# Patient Record
Sex: Male | Born: 1980
Health system: Southern US, Community
[De-identification: ages and names within clinical notes are randomized; demographics above are authoritative.]

## PROBLEM LIST (undated history)

## (undated) DIAGNOSIS — E785 Hyperlipidemia, unspecified: Secondary | ICD-10-CM

## (undated) HISTORY — DX: Hyperlipidemia, unspecified: E78.5

## (undated) HISTORY — PX: KNEE SURGERY: SHX244

## (undated) HISTORY — PX: EXTERNAL EAR SURGERY: SHX627

---

## 2000-11-19 ENCOUNTER — Emergency Department (HOSPITAL_COMMUNITY): Admission: EM | Admit: 2000-11-19 | Discharge: 2000-11-19 | Payer: Self-pay | Admitting: Emergency Medicine

## 2004-09-27 ENCOUNTER — Emergency Department (HOSPITAL_COMMUNITY): Admission: EM | Admit: 2004-09-27 | Discharge: 2004-09-27 | Payer: Self-pay | Admitting: Emergency Medicine

## 2004-09-27 ENCOUNTER — Observation Stay (HOSPITAL_COMMUNITY): Admission: EM | Admit: 2004-09-27 | Discharge: 2004-09-29 | Payer: Self-pay | Admitting: Emergency Medicine

## 2005-01-05 ENCOUNTER — Ambulatory Visit (HOSPITAL_COMMUNITY): Admission: RE | Admit: 2005-01-05 | Discharge: 2005-01-05 | Payer: Self-pay | Admitting: Orthopedic Surgery

## 2005-01-05 ENCOUNTER — Ambulatory Visit (HOSPITAL_BASED_OUTPATIENT_CLINIC_OR_DEPARTMENT_OTHER): Admission: RE | Admit: 2005-01-05 | Discharge: 2005-01-05 | Payer: Self-pay | Admitting: Orthopedic Surgery

## 2007-02-26 ENCOUNTER — Ambulatory Visit (HOSPITAL_COMMUNITY): Admission: RE | Admit: 2007-02-26 | Discharge: 2007-02-26 | Payer: Self-pay | Admitting: Family Medicine

## 2010-05-31 ENCOUNTER — Ambulatory Visit (HOSPITAL_COMMUNITY)
Admission: RE | Admit: 2010-05-31 | Discharge: 2010-05-31 | Disposition: A | Discharge status: Home / Self Care | Payer: 59 | Source: Ambulatory Visit | Attending: Family Medicine | Admitting: Family Medicine

## 2010-05-31 ENCOUNTER — Other Ambulatory Visit (HOSPITAL_COMMUNITY): Payer: Self-pay | Admitting: Family Medicine

## 2010-05-31 DIAGNOSIS — R0789 Other chest pain: Secondary | ICD-10-CM

## 2010-05-31 DIAGNOSIS — F17200 Nicotine dependence, unspecified, uncomplicated: Secondary | ICD-10-CM | POA: Insufficient documentation

## 2010-08-13 NOTE — Consult Note (Signed)
NAME:  Devon Smith, Devon Smith            ACCOUNT NO.:  [id number]   MEDICAL RECORD NO.:  [id number]          PATIENT TYPE:  OBV   LOCATION:  5041                         FACILITY:  [facility]   PHYSICIAN:  Artist Pais. Weingold, M.D.DATE OF BIRTH:  06-Apr-1980   DATE OF CONSULTATION:  09/27/2004  DATE OF DISCHARGE:                                   CONSULTATION   EMERGENCY ROOM CONSULT.   REFERRING PHYSICIAN:  Rhae Lerner. Margretta Ditty, M.D.   REASON FOR CONSULTATION:  Mr. Aveon Colquhoun is a 30 year old, right-hand-  dominant male who had a columbine injury to his non-dominant left hand, was  transferred to [facility] with an acute open injury to his left  index finger.  He is 30 years old.   ALLERGIES:  TETANUS.   PAST MEDICAL HISTORY:  No significant medical problems otherwise.  No recent  hospital admissions or surgery.   FAMILY HISTORY:  Noncontributory family medical history.   SOCIAL HISTORY:  Noncontributory.   ADMISSION PHYSICAL EXAMINATION:  GENERAL:  On exam today, a well-developed,  well-nourished male, pleasant, alert and oriented x 3.  EXTREMITIES:  Examination of his left index finger, reveals he has an  obvious, grossly contaminated open wound with exposed proximal  interphalangeal joint, loss of extensor mechanism, open PIP joint, possible  neurovascular compromise.   The x-rays show a comminuted fracture at the base of the middle phalanx with  complete obliteration of the joint space.  The rest of his exam is normal.   IMPRESSION:  A 30 year old male with a grade II, open, grossly contaminated  injury to his non-dominant left index finger.   RECOMMENDATIONS:  Recommendations at this point in time are for him to go  the operating room for immediate incision and drainage and repair as  necessary.      MAW/MEDQ  D:  09/27/2004  T:  09/27/2004  Job:  [id number]

## 2010-08-13 NOTE — Op Note (Signed)
NAMECHRISTOPER, Devon Smith            ACCOUNT NO.:  [id number]   MEDICAL RECORD NO.:  [id number]          PATIENT TYPE:  AMB   LOCATION:  DSC                          FACILITY:  [facility]   PHYSICIAN:  Artist Pais. Weingold, M.D.DATE OF BIRTH:  [birth year]   DATE OF PROCEDURE:  DATE OF DISCHARGE:                                 OPERATIVE REPORT   PREOPERATIVE DIAGNOSIS:  Left index finger traumatic injury with nonunion,  middle phalangeal fracture.   POSTOPERATIVE DIAGNOSIS:  Left index finger traumatic injury with nonunion,  middle phalangeal fracture.   OPERATION PERFORMED:  Irrigation and debridement, culturing and proximal  interphalangeal joint fusion.   SURGEON:  Aura Fey. Bobbe Medico.   ANESTHESIA:  General.   TOURNIQUET TIME:  40 minutes.   COMPLICATIONS:  None.   DRAINS:  None.   DESCRIPTION OF PROCEDURE:  The patient was taken to the operating room where  after the induction of adequate general anesthesia the left upper extremity  was prepped and draped in the usual sterile fashion.  An Esmarch was used to  exsanguinate the limb, tourniquet was inflated to 250 mmHg.  At this point  the left index finger was approached.  There ws a significant soft tissue  defect on the radial side which was debrided.  There were large osseous  fragments that were devascularized that were debrided.  These were sent for  culturing.  The entire wound was thoroughly debrided using a combination of  large curettes and rongeurs down to good healthy tissue including  debridement of the skin edges with a 15 blade.  At this point intraoperative  fluoroscopy showed a small remnant of the distal aspect of the middle  phalanx, approximately the distal third and the distal third of the proximal  phalanx.  These two were aligned and fixated with two 0.035 K-wires driven  from distal to proximal in crossed fashion across the fusion site.  The  fusion site was then compressed manually.  The K-wires were cut  and bent  outside the skin and the finger was placed in position in slight flexion at  the proximal phalangeal level in pronation to enable good pinch.  The wound  was then thoroughly irrigated again.  It was closed with 4-0 nylon.  Sterile  dressing of Xeroform, 4 x 4s, and a compressive wrap was applied as well as  a volar splint.  The patient tolerated the procedure well, went to recovery  in stable fashion.      Artist Pais Mina Marble, M.D.  Electronically Signed     MAW/MEDQ  D:  01/05/2005  T:  01/05/2005  Job:  [id number]

## 2010-08-13 NOTE — Op Note (Signed)
NAME:  Devon Smith, ROBB            ACCOUNT NO.:  [id number]   MEDICAL RECORD NO.:  [id number]          PATIENT TYPE:  OBV   LOCATION:  5041                         FACILITY:  [facility]   PHYSICIAN:  Artist Pais. Weingold, M.D.DATE OF BIRTH:  1980/12/19   DATE OF PROCEDURE:  09/27/2004  DATE OF DISCHARGE:                                 OPERATIVE REPORT   PREOPERATIVE DIAGNOSIS:  Grade 2 open contaminated fracture left index  finger.   POSTOPERATIVE DIAGNOSIS:  Grade 2 open contaminated fracture left index  finger.   PROCEDURE:  Irrigation and debridement grossly contaminated grade 2 fracture  PIP joint with ORIF, 035 K-wires x 2, as well as repair of extensor  mechanism, repair of collateral ligament, radial side, and repair of volar  plate.   SURGEON:  Artist Pais. Mina Marble, M.D.   ASSISTANT:  None.   ANESTHESIA:  General.   TOURNIQUET TIME:  1 hour 10 minutes.   COMPLICATIONS:  None.   DRAINS:  None.   DESCRIPTION OF PROCEDURE:  The patient was taken to the operating room and  after the induction of adequate general anesthesia, the left upper extremity  was prepped and draped in the usual sterile fashion.  Esmarch was used to  exsanguinate the limb and the tourniquet was inflated to 250 mmHg.  At this  point in time, a grossly contaminated open fracture dislocation at the PIP  joint was approached.  There was gross contamination and this was thoroughly  irrigated with 3 liters of normal saline.  After this was done, examination  revealed a complete loss of structural integrity of the radial side of the  proximal phalangeal joint, neurovascular bundles were intact on both the  radial and ulnar side, however, the collateral ligament, the joint capsule,  the extensor mechanism, and the base of the phalanx were completely  destroyed by this combined injury.  The patient was placed in traction and  under fluoroscopic guidance, the middle phalanx was pinned to the head of  the  proximal phalanx to restore normal anatomy for future PIP fusion.  The  extensor mechanism, the collateral ligaments, and the volar plate were all  repaired using 4-0 Vicryl.  The wound was thoroughly irrigated again and  loosely closed with 4-0 nylon.  A sterile dressing of Xeroform, 4 by 4s, and  a splint was applied.  The patient tolerated the procedure well and went to  the recovery room in stable condition.     MAW/MEDQ  D:  09/27/2004  T:  09/27/2004  Job:  [id number]

## 2011-01-26 ENCOUNTER — Other Ambulatory Visit (HOSPITAL_COMMUNITY): Payer: Self-pay | Admitting: Orthopedic Surgery

## 2011-01-26 DIAGNOSIS — M25569 Pain in unspecified knee: Secondary | ICD-10-CM

## 2011-01-28 ENCOUNTER — Ambulatory Visit (HOSPITAL_COMMUNITY)
Admission: RE | Admit: 2011-01-28 | Discharge: 2011-01-28 | Disposition: A | Discharge status: Home / Self Care | Payer: 59 | Source: Ambulatory Visit | Attending: Orthopedic Surgery | Admitting: Orthopedic Surgery

## 2011-01-28 DIAGNOSIS — M25569 Pain in unspecified knee: Secondary | ICD-10-CM | POA: Insufficient documentation

## 2011-01-28 DIAGNOSIS — M23329 Other meniscus derangements, posterior horn of medial meniscus, unspecified knee: Secondary | ICD-10-CM | POA: Insufficient documentation

## 2012-06-12 ENCOUNTER — Other Ambulatory Visit (HOSPITAL_COMMUNITY): Payer: Self-pay | Admitting: Family Medicine

## 2012-06-12 ENCOUNTER — Ambulatory Visit (HOSPITAL_COMMUNITY)
Admission: RE | Admit: 2012-06-12 | Discharge: 2012-06-12 | Disposition: A | Discharge status: Home / Self Care | Payer: 59 | Source: Ambulatory Visit | Attending: Family Medicine | Admitting: Family Medicine

## 2012-06-12 DIAGNOSIS — R918 Other nonspecific abnormal finding of lung field: Secondary | ICD-10-CM | POA: Insufficient documentation

## 2012-06-12 DIAGNOSIS — F17290 Nicotine dependence, other tobacco product, uncomplicated: Secondary | ICD-10-CM

## 2012-06-12 DIAGNOSIS — F17200 Nicotine dependence, unspecified, uncomplicated: Secondary | ICD-10-CM | POA: Insufficient documentation

## 2013-11-26 ENCOUNTER — Ambulatory Visit (HOSPITAL_COMMUNITY)
Admission: RE | Admit: 2013-11-26 | Discharge: 2013-11-26 | Disposition: A | Discharge status: Home / Self Care | Payer: 59 | Source: Ambulatory Visit | Attending: Family Medicine | Admitting: Family Medicine

## 2013-11-26 ENCOUNTER — Other Ambulatory Visit (HOSPITAL_COMMUNITY): Payer: Self-pay | Admitting: Family Medicine

## 2013-11-26 DIAGNOSIS — F17200 Nicotine dependence, unspecified, uncomplicated: Secondary | ICD-10-CM | POA: Insufficient documentation

## 2013-11-26 DIAGNOSIS — F17210 Nicotine dependence, cigarettes, uncomplicated: Secondary | ICD-10-CM

## 2015-07-14 DIAGNOSIS — I10 Essential (primary) hypertension: Secondary | ICD-10-CM | POA: Diagnosis not present

## 2015-07-14 DIAGNOSIS — E118 Type 2 diabetes mellitus with unspecified complications: Secondary | ICD-10-CM | POA: Diagnosis not present

## 2015-09-24 ENCOUNTER — Other Ambulatory Visit (HOSPITAL_COMMUNITY): Payer: Self-pay | Admitting: Orthopedic Surgery

## 2015-09-24 DIAGNOSIS — M25521 Pain in right elbow: Secondary | ICD-10-CM | POA: Diagnosis not present

## 2015-10-05 ENCOUNTER — Ambulatory Visit (HOSPITAL_COMMUNITY)
Admission: RE | Admit: 2015-10-05 | Discharge: 2015-10-05 | Disposition: A | Discharge status: Home / Self Care | Payer: 59 | Source: Ambulatory Visit | Attending: Orthopedic Surgery | Admitting: Orthopedic Surgery

## 2015-10-05 DIAGNOSIS — M25521 Pain in right elbow: Secondary | ICD-10-CM | POA: Insufficient documentation

## 2015-10-05 DIAGNOSIS — X58XXXA Exposure to other specified factors, initial encounter: Secondary | ICD-10-CM | POA: Diagnosis not present

## 2015-10-05 DIAGNOSIS — S46211A Strain of muscle, fascia and tendon of other parts of biceps, right arm, initial encounter: Secondary | ICD-10-CM | POA: Insufficient documentation

## 2015-10-05 DIAGNOSIS — M75101 Unspecified rotator cuff tear or rupture of right shoulder, not specified as traumatic: Secondary | ICD-10-CM | POA: Diagnosis not present

## 2015-12-04 DIAGNOSIS — H16201 Unspecified keratoconjunctivitis, right eye: Secondary | ICD-10-CM | POA: Diagnosis not present

## 2017-02-06 DIAGNOSIS — Z0001 Encounter for general adult medical examination with abnormal findings: Secondary | ICD-10-CM | POA: Diagnosis not present

## 2017-02-06 DIAGNOSIS — E782 Mixed hyperlipidemia: Secondary | ICD-10-CM | POA: Diagnosis not present

## 2017-02-06 DIAGNOSIS — Z6833 Body mass index (BMI) 33.0-33.9, adult: Secondary | ICD-10-CM | POA: Diagnosis not present

## 2017-02-06 DIAGNOSIS — Z1389 Encounter for screening for other disorder: Secondary | ICD-10-CM | POA: Diagnosis not present

## 2017-02-06 DIAGNOSIS — E6609 Other obesity due to excess calories: Secondary | ICD-10-CM | POA: Diagnosis not present

## 2017-02-06 DIAGNOSIS — Z23 Encounter for immunization: Secondary | ICD-10-CM | POA: Diagnosis not present

## 2017-05-10 DIAGNOSIS — M25562 Pain in left knee: Secondary | ICD-10-CM | POA: Diagnosis not present

## 2017-05-10 DIAGNOSIS — M238X2 Other internal derangements of left knee: Secondary | ICD-10-CM | POA: Diagnosis not present

## 2017-06-03 DIAGNOSIS — M25562 Pain in left knee: Secondary | ICD-10-CM | POA: Diagnosis not present

## 2017-06-29 DIAGNOSIS — M94262 Chondromalacia, left knee: Secondary | ICD-10-CM | POA: Diagnosis not present

## 2017-06-29 DIAGNOSIS — S83242A Other tear of medial meniscus, current injury, left knee, initial encounter: Secondary | ICD-10-CM | POA: Diagnosis not present

## 2017-06-29 DIAGNOSIS — G8918 Other acute postprocedural pain: Secondary | ICD-10-CM | POA: Diagnosis not present

## 2017-06-29 DIAGNOSIS — M23232 Derangement of other medial meniscus due to old tear or injury, left knee: Secondary | ICD-10-CM | POA: Diagnosis not present

## 2017-06-29 MED FILL — MELOXICAM 15 MG TABLET: 15 | 30 days supply | Qty: 60 | Fill #0

## 2017-06-29 MED FILL — ASPIRIN EC 325 MG TABLET: 325 | 30 days supply | Qty: 30 | Fill #0

## 2017-06-29 MED FILL — HYDROCODON-APAP 5-325: 5-325 | 5 days supply | Qty: 30 | Fill #0

## 2017-10-11 ENCOUNTER — Ambulatory Visit
Admission: RE | Admit: 2017-10-11 | Discharge: 2017-10-11 | Disposition: A | Discharge status: Home / Self Care | Payer: 59 | Source: Ambulatory Visit | Attending: Surgery | Admitting: Surgery

## 2017-10-11 ENCOUNTER — Other Ambulatory Visit: Payer: Self-pay | Admitting: Surgery

## 2017-10-11 DIAGNOSIS — R112 Nausea with vomiting, unspecified: Secondary | ICD-10-CM

## 2017-10-11 DIAGNOSIS — R1031 Right lower quadrant pain: Secondary | ICD-10-CM | POA: Diagnosis not present

## 2017-10-11 MED ORDER — IOPAMIDOL (ISOVUE-300) INJECTION 61%
100.0000 mL | Freq: Once | INTRAVENOUS | Status: AC | PRN
  Administered 2017-10-11: 100 mL via INTRAVENOUS

## 2017-10-12 DIAGNOSIS — R112 Nausea with vomiting, unspecified: Secondary | ICD-10-CM | POA: Diagnosis not present

## 2017-10-16 ENCOUNTER — Other Ambulatory Visit: Payer: Self-pay | Admitting: Surgery

## 2017-11-09 DIAGNOSIS — Z1389 Encounter for screening for other disorder: Secondary | ICD-10-CM | POA: Diagnosis not present

## 2017-11-09 DIAGNOSIS — Z024 Encounter for examination for driving license: Secondary | ICD-10-CM | POA: Diagnosis not present

## 2017-11-09 DIAGNOSIS — E6609 Other obesity due to excess calories: Secondary | ICD-10-CM | POA: Diagnosis not present

## 2017-11-09 DIAGNOSIS — R319 Hematuria, unspecified: Secondary | ICD-10-CM | POA: Diagnosis not present

## 2017-11-09 DIAGNOSIS — Z6833 Body mass index (BMI) 33.0-33.9, adult: Secondary | ICD-10-CM | POA: Diagnosis not present

## 2017-11-09 DIAGNOSIS — E782 Mixed hyperlipidemia: Secondary | ICD-10-CM | POA: Diagnosis not present

## 2017-12-08 DIAGNOSIS — J029 Acute pharyngitis, unspecified: Secondary | ICD-10-CM | POA: Diagnosis not present

## 2017-12-08 DIAGNOSIS — Z1389 Encounter for screening for other disorder: Secondary | ICD-10-CM | POA: Diagnosis not present

## 2017-12-08 DIAGNOSIS — J020 Streptococcal pharyngitis: Secondary | ICD-10-CM | POA: Diagnosis not present

## 2017-12-08 DIAGNOSIS — Z6834 Body mass index (BMI) 34.0-34.9, adult: Secondary | ICD-10-CM | POA: Diagnosis not present

## 2017-12-08 MED FILL — AMOX-CLAV 875-125 MG TABLET: 875-125 | 10 days supply | Qty: 20 | Fill #0

## 2019-10-03 DIAGNOSIS — H6981 Other specified disorders of Eustachian tube, right ear: Secondary | ICD-10-CM | POA: Diagnosis not present

## 2019-10-03 DIAGNOSIS — H7292 Unspecified perforation of tympanic membrane, left ear: Secondary | ICD-10-CM | POA: Diagnosis not present

## 2019-10-03 DIAGNOSIS — H900 Conductive hearing loss, bilateral: Secondary | ICD-10-CM | POA: Diagnosis not present

## 2019-10-22 ENCOUNTER — Other Ambulatory Visit (HOSPITAL_COMMUNITY): Payer: Self-pay | Admitting: Family Medicine

## 2019-10-22 ENCOUNTER — Ambulatory Visit (HOSPITAL_COMMUNITY)
Admission: RE | Admit: 2019-10-22 | Discharge: 2019-10-22 | Disposition: A | Discharge status: Home / Self Care | Payer: 59 | Source: Ambulatory Visit | Attending: Family Medicine | Admitting: Family Medicine

## 2019-10-22 ENCOUNTER — Other Ambulatory Visit: Payer: Self-pay

## 2019-10-22 DIAGNOSIS — Z6832 Body mass index (BMI) 32.0-32.9, adult: Secondary | ICD-10-CM | POA: Diagnosis not present

## 2019-10-22 DIAGNOSIS — E782 Mixed hyperlipidemia: Secondary | ICD-10-CM | POA: Diagnosis not present

## 2019-10-22 DIAGNOSIS — R059 Cough, unspecified: Secondary | ICD-10-CM

## 2019-10-22 DIAGNOSIS — R05 Cough: Secondary | ICD-10-CM | POA: Insufficient documentation

## 2019-10-22 DIAGNOSIS — Z0001 Encounter for general adult medical examination with abnormal findings: Secondary | ICD-10-CM | POA: Diagnosis not present

## 2019-10-22 DIAGNOSIS — Z1389 Encounter for screening for other disorder: Secondary | ICD-10-CM | POA: Diagnosis not present

## 2019-10-22 DIAGNOSIS — R000 Tachycardia, unspecified: Secondary | ICD-10-CM | POA: Diagnosis not present

## 2019-10-22 DIAGNOSIS — Z719 Counseling, unspecified: Secondary | ICD-10-CM | POA: Diagnosis not present

## 2019-10-22 DIAGNOSIS — E6609 Other obesity due to excess calories: Secondary | ICD-10-CM | POA: Diagnosis not present

## 2020-02-04 DIAGNOSIS — M25511 Pain in right shoulder: Secondary | ICD-10-CM | POA: Diagnosis not present

## 2020-02-04 DIAGNOSIS — M7541 Impingement syndrome of right shoulder: Secondary | ICD-10-CM | POA: Diagnosis not present

## 2020-02-04 DIAGNOSIS — M25512 Pain in left shoulder: Secondary | ICD-10-CM | POA: Diagnosis not present

## 2020-04-01 ENCOUNTER — Other Ambulatory Visit (HOSPITAL_COMMUNITY): Payer: Self-pay | Admitting: Physician Assistant

## 2020-04-01 DIAGNOSIS — M25511 Pain in right shoulder: Secondary | ICD-10-CM

## 2020-04-01 DIAGNOSIS — M25512 Pain in left shoulder: Secondary | ICD-10-CM

## 2020-04-14 ENCOUNTER — Ambulatory Visit (HOSPITAL_COMMUNITY)
Admission: RE | Admit: 2020-04-14 | Discharge: 2020-04-14 | Disposition: A | Discharge status: Home / Self Care | Payer: 59 | Source: Ambulatory Visit | Attending: Physician Assistant | Admitting: Physician Assistant

## 2020-04-14 ENCOUNTER — Other Ambulatory Visit: Payer: Self-pay

## 2020-04-14 DIAGNOSIS — M75122 Complete rotator cuff tear or rupture of left shoulder, not specified as traumatic: Secondary | ICD-10-CM | POA: Diagnosis not present

## 2020-04-14 DIAGNOSIS — M25512 Pain in left shoulder: Secondary | ICD-10-CM | POA: Diagnosis not present

## 2020-04-14 DIAGNOSIS — M19011 Primary osteoarthritis, right shoulder: Secondary | ICD-10-CM | POA: Diagnosis not present

## 2020-04-14 DIAGNOSIS — M25511 Pain in right shoulder: Secondary | ICD-10-CM | POA: Insufficient documentation

## 2020-04-20 DIAGNOSIS — Z20822 Contact with and (suspected) exposure to covid-19: Secondary | ICD-10-CM | POA: Diagnosis not present

## 2020-04-28 DIAGNOSIS — M25511 Pain in right shoulder: Secondary | ICD-10-CM | POA: Diagnosis not present

## 2020-04-28 DIAGNOSIS — M25512 Pain in left shoulder: Secondary | ICD-10-CM | POA: Diagnosis not present

## 2020-04-28 DIAGNOSIS — M75100 Unspecified rotator cuff tear or rupture of unspecified shoulder, not specified as traumatic: Secondary | ICD-10-CM | POA: Diagnosis not present

## 2021-03-04 DIAGNOSIS — J111 Influenza due to unidentified influenza virus with other respiratory manifestations: Secondary | ICD-10-CM | POA: Diagnosis not present

## 2021-09-14 IMAGING — MR MR SHOULDER*L* W/O CM
6 series · 40 of 40 positions shown · non-contrast
Comparison: None.

CLINICAL DATA: Left shoulder pain for 2 months

EXAM:
MRI OF THE LEFT SHOULDER WITHOUT CONTRAST
TECHNIQUE: Multiplanar, multisequence MR imaging of the shoulder was performed.
No intravenous contrast was administered.

[Series 5: T2 fat-sat · axial · left · 4.0mm · 0.47mm/px · z∈[-116,-8]mm · 6 of 26 slices shown (1 of 3)]
[im 1/26]
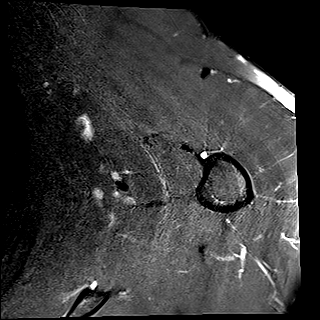
[im 6/26]
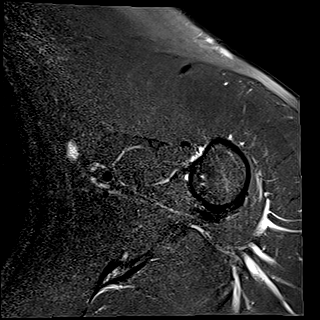
[im 11/26]
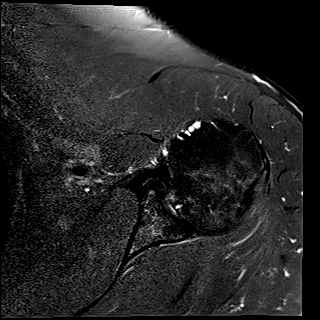
[im 16/26]
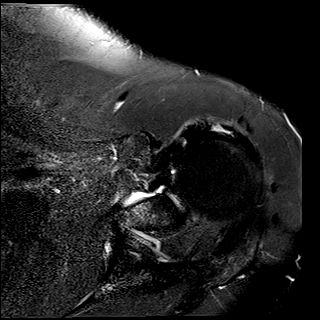
[im 21/26]
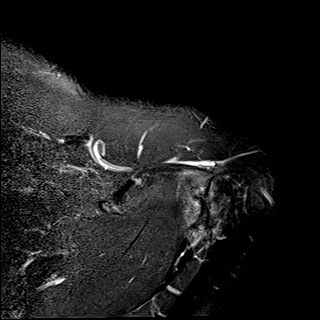
[im 26/26]
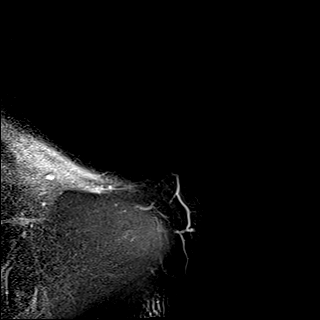

[Series 6: T2 fat-sat · oblique · left · 4.0mm · 0.50mm/px · 6 of 26 slices shown (2 of 3)]
[im 1/26]
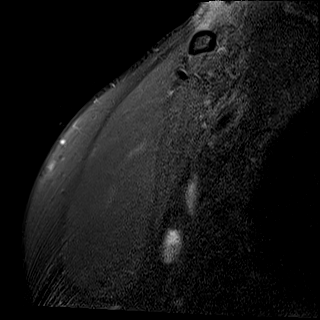
[im 6/26]
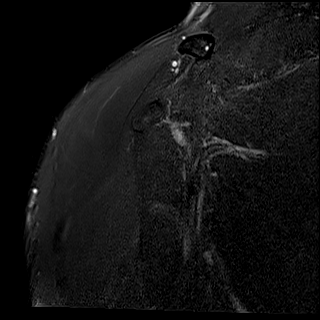
[im 11/26]
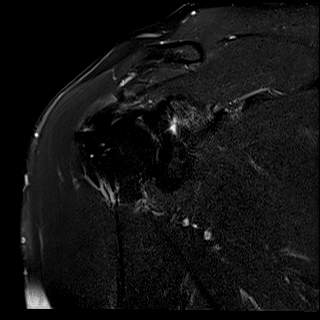
[im 16/26]
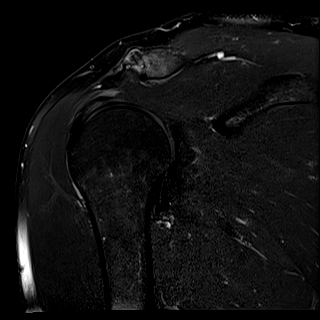
[im 21/26]
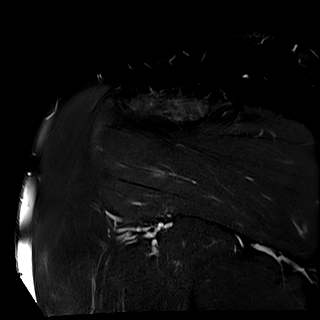
[im 26/26]
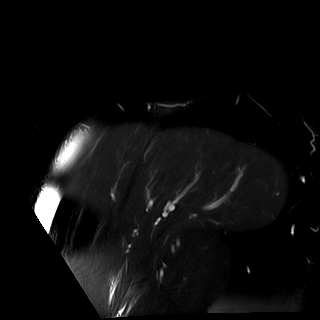

[Series 7: PD · oblique · left · 4.0mm · 0.50mm/px · 7 of 26 slices shown]
[im 1/26]
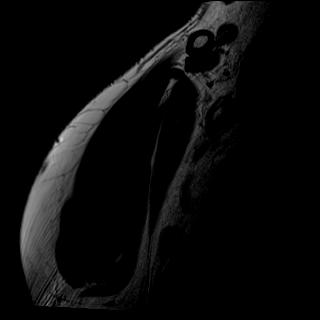
[im 5/26]
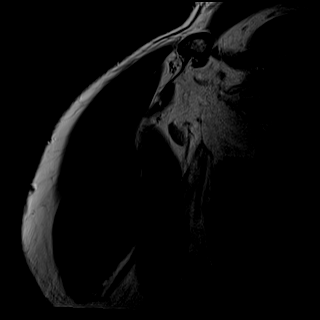
[im 9/26]
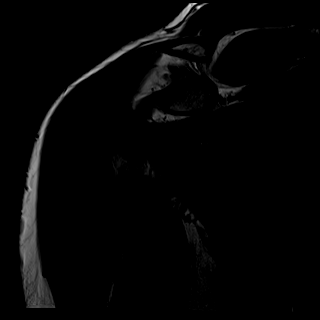
[im 13/26]
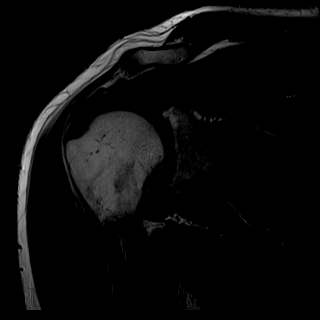
[im 17/26]
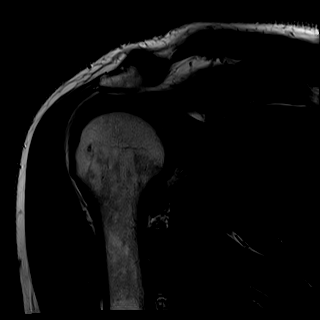
[im 21/26]
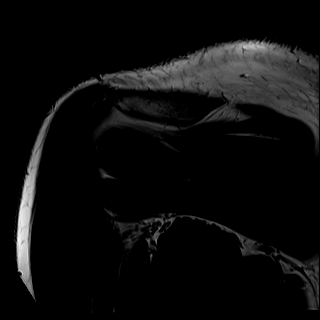
[im 26/26]
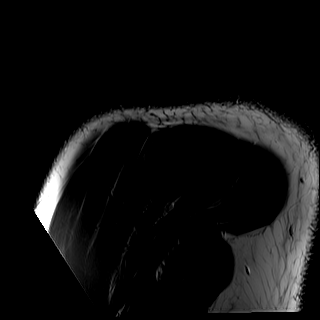

[Series 8: T2 fat-sat · oblique · left · 4.0mm · 0.50mm/px · 7 of 25 slices shown (3 of 3)]
[im 1/25]
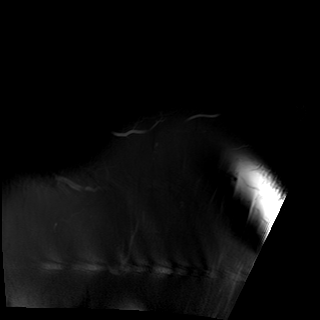
[im 5/25]
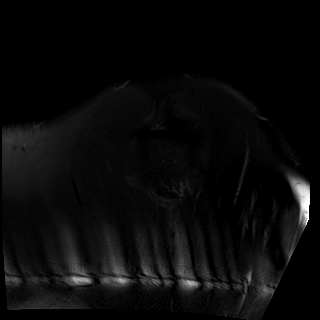
[im 9/25]
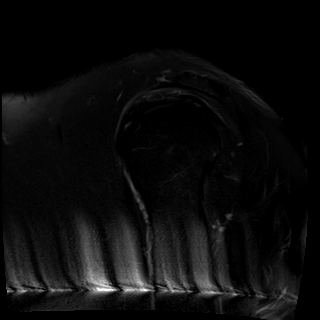
[im 13/25]
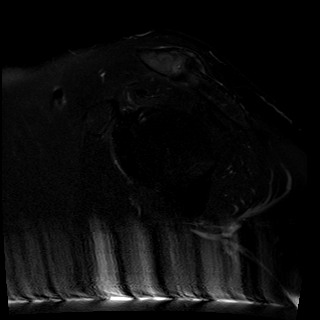
[im 17/25]
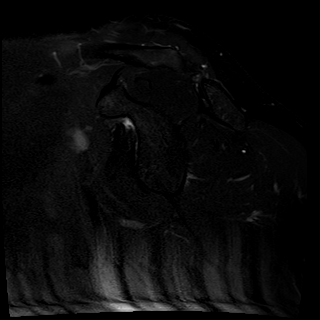
[im 21/25]
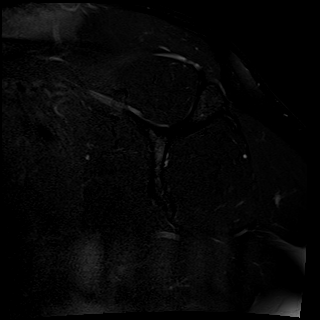
[im 25/25]
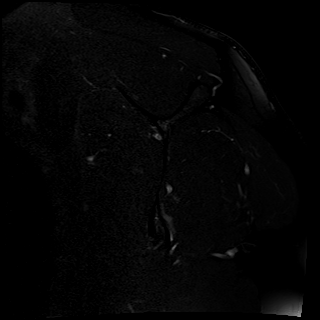

[Series 9: T1 · oblique · left · 4.0mm · 0.43mm/px · 7 of 25 slices shown]
[im 1/25]
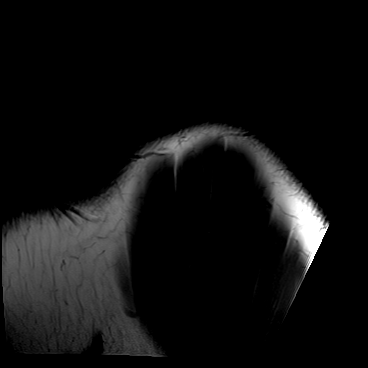
[im 5/25]
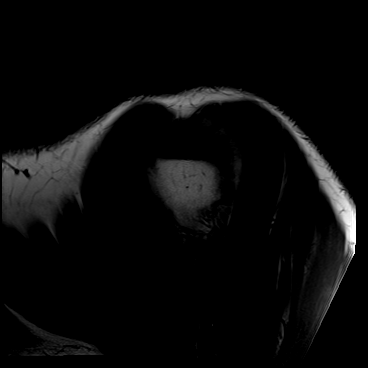
[im 9/25]
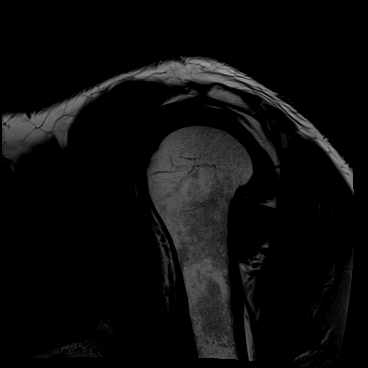
[im 13/25]
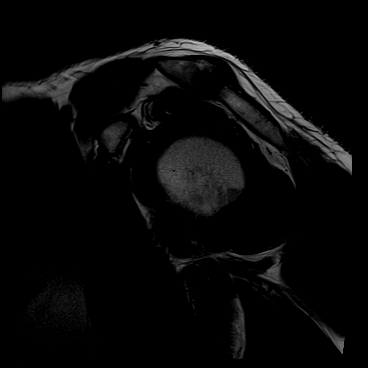
[im 17/25]
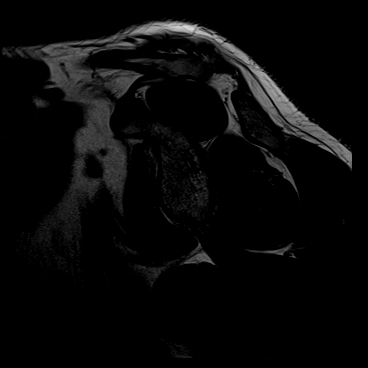
[im 21/25]
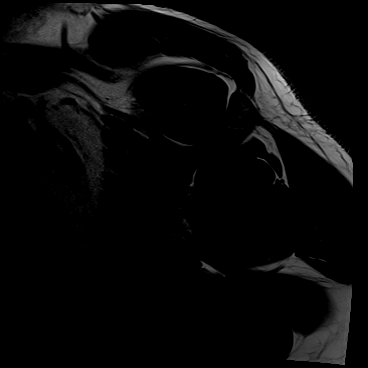
[im 25/25]
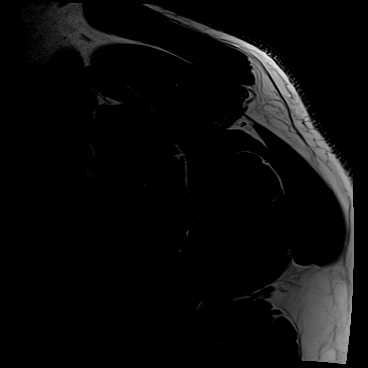

[Series 10: STIR · oblique · left · 4.0mm · 0.62mm/px · 7 of 25 slices shown]
[im 1/25]
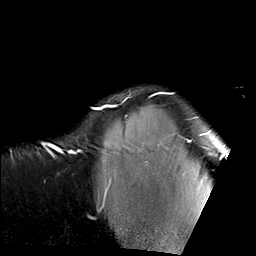
[im 5/25]
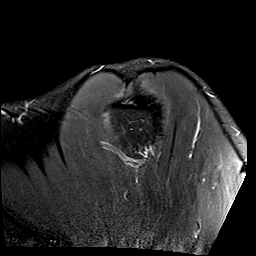
[im 9/25]
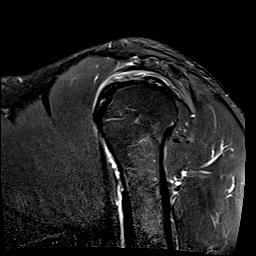
[im 13/25]
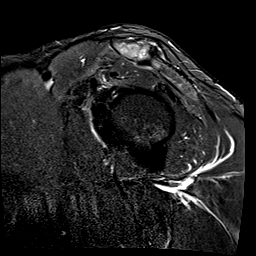
[im 17/25]
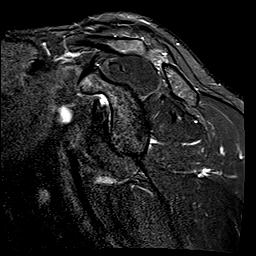
[im 21/25]
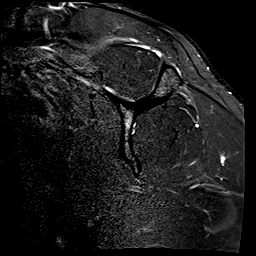
[im 25/25]
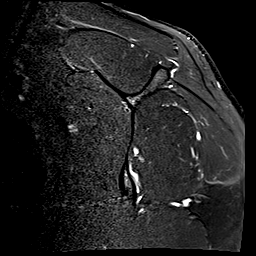

[40 of 40 positions shown; findings below may reference images not displayed]

FINDINGS: Rotator cuff: Near full-thickness articular sided tear of the
anterior insertional fibers of the distal supraspinatus tendon
measuring 11 mm in AP dimension (series 6, images 12-13). Mild
subscapularis tendinosis without focal tear. Infraspinatus and teres
minor tendons intact. No full thickness rotator cuff tear.

Muscles: Preserved bulk and signal intensity of the rotator cuff
musculature without edema, atrophy, or fatty infiltration.

Biceps long head:  Intact.

Acromioclavicular Joint: Mild arthropathy of the acromioclavicular
joint. No subacromial-subdeltoid bursal fluid.

Glenohumeral Joint: No joint effusion. No chondral defect.

Labrum: Grossly intact, but evaluation is limited by lack of
intraarticular fluid.

Bones:  No marrow abnormality, fracture or dislocation.

Other: None.
IMPRESSION: 1. Focal high-grade articular-sided tear of the anterior insertional
fibers of the distal supraspinatus tendon.
2. Mild subscapularis tendinosis without focal tear.
3. Mild acromioclavicular arthropathy.

## 2021-10-15 DIAGNOSIS — E785 Hyperlipidemia, unspecified: Secondary | ICD-10-CM | POA: Diagnosis not present

## 2021-10-15 DIAGNOSIS — Z6833 Body mass index (BMI) 33.0-33.9, adult: Secondary | ICD-10-CM | POA: Diagnosis not present

## 2021-10-15 DIAGNOSIS — Z0001 Encounter for general adult medical examination with abnormal findings: Secondary | ICD-10-CM | POA: Diagnosis not present

## 2022-05-04 NOTE — Progress Notes (Deleted)
     Rexford Maus, MD Reason for referral-hyperlipidemia  HPI: 42 year old male for evaluation of hyperlipidemia at request of Sharilyn Sites, MD.  No current outpatient medications on file.   No current facility-administered medications for this visit.    Not on File  No past medical history on file.  *** The histories are not reviewed yet. Please review them in the "History" navigator section and refresh this [place].  Social History   Socioeconomic History   Marital status: Married    Spouse name: Not on file   Number of children: Not on file   Years of education: Not on file   Highest education level: Not on file  Occupational History   Not on file  Tobacco Use   Smoking status: Not on file   Smokeless tobacco: Not on file  Substance and Sexual Activity   Alcohol use: Not on file   Drug use: Not on file   Sexual activity: Not on file  Other Topics Concern   Not on file  Social History Narrative   Not on file   Social Determinants of Health   Financial Resource Strain: Not on file  Food Insecurity: Not on file  Transportation Needs: Not on file  Physical Activity: Not on file  Stress: Not on file  Social Connections: Not on file  Intimate Partner Violence: Not on file    No family history on file.  ROS: no fevers or chills, productive cough, hemoptysis, dysphasia, odynophagia, melena, hematochezia, dysuria, hematuria, rash, seizure activity, orthopnea, PND, pedal edema, claudication. Remaining systems are negative.  Physical Exam:   There were no vitals taken for this visit.  General:  Well developed/well nourished in NAD Skin warm/dry Patient not depressed No peripheral clubbing Back-normal HEENT-normal/normal eyelids Neck supple/normal carotid upstroke bilaterally; no bruits; no JVD; no thyromegaly chest - CTA/ normal expansion CV - RRR/normal S1 and S2; no murmurs, rubs or gallops;  PMI nondisplaced Abdomen -NT/ND, no HSM, no mass,  + bowel sounds, no bruit 2+ femoral pulses, no bruits Ext-no edema, chords, 2+ DP Neuro-grossly nonfocal  ECG - personally reviewed  A/P  1 hyperlipidemia-  Kirk Ruths, MD

## 2022-05-18 ENCOUNTER — Other Ambulatory Visit (HOSPITAL_COMMUNITY): Payer: Self-pay

## 2022-05-18 ENCOUNTER — Ambulatory Visit: Payer: Commercial Managed Care - PPO | Attending: Cardiology | Admitting: Cardiology

## 2022-05-18 ENCOUNTER — Ambulatory Visit: Payer: Commercial Managed Care - PPO | Admitting: Cardiology

## 2022-05-18 ENCOUNTER — Encounter: Payer: Self-pay | Admitting: Cardiology

## 2022-05-18 VITALS — BP 110/70 | HR 73 | Ht 72.0 in | Wt 251.8 lb

## 2022-05-18 DIAGNOSIS — R072 Precordial pain: Secondary | ICD-10-CM

## 2022-05-18 MED ORDER — ROSUVASTATIN CALCIUM 40 MG PO TABS
40.0000 mg | ORAL_TABLET | Freq: Every day | ORAL | 3 refills | Status: AC
  Filled 2022-05-18: qty 90, 90d supply, fill #0

## 2022-05-18 MED ORDER — METOPROLOL TARTRATE 100 MG PO TABS
ORAL_TABLET | ORAL | 0 refills | Status: AC
  Filled 2022-05-18: qty 1, 1d supply, fill #0

## 2022-05-18 NOTE — Progress Notes (Signed)
     Referring-Angus McInnis, MD Reason for referral-hyperlipidemia and chest pain  HPI: 42 year old male for evaluation of hyperlipidemia and chest pain at request of Marjean Donna MD. Laboratories July 2023 showed total cholesterol 243 and LDL 143.  Patient has had occasional chest pain for approximately 1 year.  The pain is in the chest area and described as "pushing out".  Not clearly related to activities.  Typically last 15 minutes and resolves.  No associated symptoms.  He also has dyspnea on exertion but no orthopnea, PND and no history of syncope.  Occasional minimal pedal edema.  Because of the above cardiology asked to evaluate.  No current outpatient medications on file.   No current facility-administered medications for this visit.    No Known Allergies   Past Medical History:  Diagnosis Date   Hyperlipidemia     Past Surgical History:  Procedure Laterality Date   EXTERNAL EAR SURGERY     KNEE SURGERY      Social History   Socioeconomic History   Marital status: Married    Spouse name: Not on file   Number of children: Not on file   Years of education: Not on file   Highest education level: Not on file  Occupational History   Not on file  Tobacco Use   Smoking status: Every Day    Types: Cigarettes   Smokeless tobacco: Current  Substance and Sexual Activity   Alcohol use: Yes    Comment: Occasional   Drug use: Never   Sexual activity: Not on file  Other Topics Concern   Not on file  Social History Narrative   Not on file   Social Determinants of Health   Financial Resource Strain: Not on file  Food Insecurity: Not on file  Transportation Needs: Not on file  Physical Activity: Not on file  Stress: Not on file  Social Connections: Not on file  Intimate Partner Violence: Not on file    Family History  Problem Relation Age of Onset   Coronary artery disease Father     ROS: no fevers or chills, productive cough, hemoptysis, dysphasia,  odynophagia, melena, hematochezia, dysuria, hematuria, rash, seizure activity, orthopnea, PND, pedal edema, claudication. Remaining systems are negative.  Physical Exam:   Blood pressure 110/70, pulse 73, height 6' (1.829 m), weight 251 lb 12.8 oz (114.2 kg), SpO2 98 %.  General:  Well developed/well nourished in NAD Skin warm/dry Patient not depressed No peripheral clubbing Back-normal HEENT-normal/normal eyelids Neck supple/normal carotid upstroke bilaterally; no bruits; no JVD; no thyromegaly chest - CTA/ normal expansion CV - RRR/normal S1 and S2; no murmurs, rubs or gallops;  PMI nondisplaced Abdomen -NT/ND, no HSM, no mass, + bowel sounds, no bruit 2+ femoral pulses, no bruits Ext-no edema, chords, 2+ DP Neuro-grossly nonfocal  ECG -normal sinus rhythm at a rate of 73, no ST changes.  Personally reviewed  A/P  1 chest pain-symptoms are atypical.  His electrocardiogram shows no ST changes.  However he has multiple risk factors including tobacco use, strong family history and hyperlipidemia.  Will schedule cardiac CTA to rule out obstructive coronary disease.  2 tobacco abuse-patient counseled on discontinuing.  3 hyperlipidemia-given strong family history would recommend statin therapy.  Begin Crestor 40 mg daily.  In 8-week check lipids and liver.  We discussed diet modification as well.  Kirk Ruths, MD

## 2022-05-18 NOTE — Patient Instructions (Signed)
Medication Instructions:   START ROSUVASTATIN 40 MG ONCE DAILY  *If you need a refill on your cardiac medications before your next appointment, please call your pharmacy*   Lab Work:  Your physician recommends that you return for lab work in: [place]  If you have labs (blood work) drawn today and your tests are completely normal, you will receive your results only by: [place] (if you have MyChart) OR A paper copy in the mail If you have any lab test that is abnormal or we need to change your treatment, we will call you to review the results.   Testing/Procedures:    Your cardiac CT will be scheduled at   [facility] [place], [place] [address] [phone]    If scheduled at [facility], please arrive at the [facility] and Children's Entrance (Entrance C2) of [facility] 30 minutes prior to test start time. You can use the FREE valet parking offered at entrance C (encouraged to control the heart rate for the test)  Proceed to the [facility] Radiology Department (first floor) to check-in and test prep.  All radiology patients and guests should use entrance C2 at [facility], accessed from [facility], even though the hospital's physical address listed is [address]       Please follow these instructions carefully (unless otherwise directed):  Hold all erectile dysfunction medications at least 3 days (72 hrs) prior to test. (Ie viagra, cialis, sildenafil, tadalafil, etc) We will administer nitroglycerin during this exam.   On the Night Before the Test: Be sure to Drink plenty of water. Do not consume any caffeinated/decaffeinated beverages or chocolate 12 hours prior to your test. Do not take any antihistamines 12 hours prior to your test.  On the Day of the Test: Drink plenty of water until 1 hour prior to the test. Do not eat any food 1 hour prior to test. You may take your  regular medications prior to the test.  Take metoprolol (Lopressor) 100 MG two hours prior to test.        After the Test: Drink plenty of water. After receiving IV contrast, you may experience a mild flushed feeling. This is normal. On occasion, you may experience a mild rash up to 24 hours after the test. This is not dangerous. If this occurs, you can take Benadryl 25 mg and increase your fluid intake. If you experience trouble breathing, this can be serious. If it is severe call 911 IMMEDIATELY. If it is mild, please call our office.   We will call to schedule your test 2-4 weeks out understanding that some insurance companies will need an authorization prior to the service being performed.   For non-scheduling related questions, please contact the cardiac imaging nurse navigator should you have any questions/concerns: Marchia Bond, Cardiac Imaging Nurse Navigator Gordy Clement, Cardiac Imaging Nurse Navigator [facility] Direct Office Dial: [phone]   For scheduling needs, including cancellations and rescheduling, please call [place], [phone].    Follow-Up: At [facility], you and your health needs are our priority.  As part of our continuing mission to provide you with exceptional heart care, we have created designated Provider Care Teams.  These Care Teams include your primary Cardiologist (physician) and Advanced Practice Providers (APPs -  Physician Assistants and Nurse Practitioners) who all work together to provide you with the care you need, when you need it.  We  recommend signing up for the patient portal called "MyChart".  Sign up information is provided on this After Visit Summary.  MyChart is used to connect with patients for Virtual Visits (Telemedicine).  Patients are able to view lab/test results, encounter notes, upcoming appointments, etc.  Non-urgent messages can be sent to your provider as well.   To learn more about  what you can do with MyChart, go to [email or web address].    Your next appointment:   6 month(s)  Provider:   Kirk Ruths MD

## 2022-05-30 ENCOUNTER — Telehealth (HOSPITAL_COMMUNITY): Payer: Self-pay | Admitting: *Deleted

## 2022-05-30 NOTE — Telephone Encounter (Signed)
Attempted to call patient regarding upcoming cardiac CT appointment. °Left message on voicemail with name and callback number ° °Devon Dupas RN Navigator Cardiac Imaging °[facility] °[phone] Office °[phone] Cell ° °

## 2022-06-01 ENCOUNTER — Ambulatory Visit (HOSPITAL_COMMUNITY)
Admission: RE | Admit: 2022-06-01 | Discharge: 2022-06-01 | Disposition: A | Discharge status: Home / Self Care | Payer: Commercial Managed Care - PPO | Source: Ambulatory Visit | Attending: Cardiology | Admitting: Cardiology

## 2022-06-01 DIAGNOSIS — R072 Precordial pain: Secondary | ICD-10-CM | POA: Insufficient documentation

## 2022-06-01 MED ORDER — IOHEXOL 350 MG/ML SOLN
100.0000 mL | Freq: Once | INTRAVENOUS | Status: AC | PRN
  Administered 2022-06-01: 100 mL via INTRAVENOUS

## 2022-06-01 MED ORDER — NITROGLYCERIN 0.4 MG SL SUBL
0.8000 mg | SUBLINGUAL_TABLET | Freq: Once | SUBLINGUAL | Status: AC
Start: 2022-06-01 — End: 2022-06-01
  Administered 2022-06-01: 0.8 mg via SUBLINGUAL

## 2022-06-01 MED ORDER — NITROGLYCERIN 0.4 MG SL SUBL
SUBLINGUAL_TABLET | SUBLINGUAL | Status: AC
  Filled 2022-06-01: qty 2

## 2022-06-06 ENCOUNTER — Ambulatory Visit: Payer: Commercial Managed Care - PPO | Admitting: Cardiology

## 2022-06-29 DIAGNOSIS — H109 Unspecified conjunctivitis: Secondary | ICD-10-CM | POA: Diagnosis not present

## 2022-08-02 ENCOUNTER — Encounter: Payer: Self-pay | Admitting: *Deleted

## 2022-08-11 DIAGNOSIS — J020 Streptococcal pharyngitis: Secondary | ICD-10-CM | POA: Diagnosis not present

## 2022-10-12 DIAGNOSIS — M79644 Pain in right finger(s): Secondary | ICD-10-CM | POA: Diagnosis not present

## 2022-10-12 DIAGNOSIS — R2231 Localized swelling, mass and lump, right upper limb: Secondary | ICD-10-CM | POA: Diagnosis not present

## 2022-11-02 NOTE — Progress Notes (Deleted)
     HPI: FU hyperlipidemia and chest pain.  Coronary CTA March 2024 showed calcium score 0 and no coronary disease.  Since last seen  Current Outpatient Medications  Medication Sig Dispense Refill   metoprolol tartrate (LOPRESSOR) 100 MG tablet TAKE 1 TABLET BY MOUTH 2 HOURS PRIOR TO CT SCAN. 1 tablet 0   rosuvastatin (CRESTOR) 40 MG tablet Take 1 tablet (40 mg total) by mouth daily. 90 tablet 3   No current facility-administered medications for this visit.     Past Medical History:  Diagnosis Date   Hyperlipidemia     Past Surgical History:  Procedure Laterality Date   EXTERNAL EAR SURGERY     KNEE SURGERY      Social History   Socioeconomic History   Marital status: Married    Spouse name: Not on file   Number of children: Not on file   Years of education: Not on file   Highest education level: Not on file  Occupational History   Not on file  Tobacco Use   Smoking status: Every Day    Types: Cigarettes   Smokeless tobacco: Current  Substance and Sexual Activity   Alcohol use: Yes    Comment: Occasional   Drug use: Never   Sexual activity: Not on file  Other Topics Concern   Not on file  Social History Narrative   Not on file   Social Determinants of Health   Financial Resource Strain: Not on file  Food Insecurity: Not on file  Transportation Needs: Not on file  Physical Activity: Not on file  Stress: Not on file  Social Connections: Not on file  Intimate Partner Violence: Not on file    Family History  Problem Relation Age of Onset   Coronary artery disease Father     ROS: no fevers or chills, productive cough, hemoptysis, dysphasia, odynophagia, melena, hematochezia, dysuria, hematuria, rash, seizure activity, orthopnea, PND, pedal edema, claudication. Remaining systems are negative.  Physical Exam: Well-developed well-nourished in no acute distress.  Skin is warm and dry.  HEENT is normal.  Neck is supple.  Chest is clear to auscultation  with normal expansion.  Cardiovascular exam is regular rate and rhythm.  Abdominal exam nontender or distended. No masses palpated. Extremities show no edema. neuro grossly intact  ECG- personally reviewed  A/P  1 chest pain-patient has had no recurrences since previous office visit.  Coronary CTA showed no coronary disease.  2 hyperlipidemia-continue Crestor.  3 history of tobacco abuse-  Olga Millers, MD

## 2022-11-08 ENCOUNTER — Ambulatory Visit: Payer: Commercial Managed Care - PPO | Admitting: Cardiology

## 2023-03-24 ENCOUNTER — Emergency Department (HOSPITAL_COMMUNITY)
Admission: EM | Admit: 2023-03-24 | Discharge: 2023-03-24 | Disposition: A | Discharge status: Home / Self Care | Payer: Commercial Managed Care - PPO | Attending: Emergency Medicine | Admitting: Emergency Medicine

## 2023-03-24 ENCOUNTER — Emergency Department (HOSPITAL_COMMUNITY): Payer: Commercial Managed Care - PPO

## 2023-03-24 DIAGNOSIS — W1789XA Other fall from one level to another, initial encounter: Secondary | ICD-10-CM | POA: Insufficient documentation

## 2023-03-24 DIAGNOSIS — J9811 Atelectasis: Secondary | ICD-10-CM | POA: Diagnosis not present

## 2023-03-24 DIAGNOSIS — S4991XA Unspecified injury of right shoulder and upper arm, initial encounter: Secondary | ICD-10-CM | POA: Diagnosis present

## 2023-03-24 DIAGNOSIS — S299XXA Unspecified injury of thorax, initial encounter: Secondary | ICD-10-CM | POA: Diagnosis not present

## 2023-03-24 DIAGNOSIS — S42024A Nondisplaced fracture of shaft of right clavicle, initial encounter for closed fracture: Secondary | ICD-10-CM | POA: Insufficient documentation

## 2023-03-24 DIAGNOSIS — S0003XA Contusion of scalp, initial encounter: Secondary | ICD-10-CM | POA: Diagnosis not present

## 2023-03-24 DIAGNOSIS — M542 Cervicalgia: Secondary | ICD-10-CM | POA: Diagnosis not present

## 2023-03-24 DIAGNOSIS — M47812 Spondylosis without myelopathy or radiculopathy, cervical region: Secondary | ICD-10-CM | POA: Diagnosis not present

## 2023-03-24 DIAGNOSIS — S42021A Displaced fracture of shaft of right clavicle, initial encounter for closed fracture: Secondary | ICD-10-CM | POA: Diagnosis not present

## 2023-03-24 DIAGNOSIS — I10 Essential (primary) hypertension: Secondary | ICD-10-CM | POA: Diagnosis not present

## 2023-03-24 DIAGNOSIS — F17210 Nicotine dependence, cigarettes, uncomplicated: Secondary | ICD-10-CM | POA: Diagnosis not present

## 2023-03-24 DIAGNOSIS — S3991XA Unspecified injury of abdomen, initial encounter: Secondary | ICD-10-CM | POA: Diagnosis not present

## 2023-03-24 DIAGNOSIS — S3993XA Unspecified injury of pelvis, initial encounter: Secondary | ICD-10-CM | POA: Diagnosis not present

## 2023-03-24 LAB — CBC WITH DIFFERENTIAL/PLATELET
Abs Immature Granulocytes: 0.05 10*3/uL (ref 0.00–0.07)
Basophils Absolute: 0.1 10*3/uL (ref 0.0–0.1)
Basophils Relative: 1 %
Eosinophils Absolute: 0.1 10*3/uL (ref 0.0–0.5)
Eosinophils Relative: 1 %
HCT: 49.4 % (ref 39.0–52.0)
Hemoglobin: 17 g/dL (ref 13.0–17.0)
Immature Granulocytes: 1 %
Lymphocytes Relative: 11 %
Lymphs Abs: 1.1 10*3/uL (ref 0.7–4.0)
MCH: 30.7 pg (ref 26.0–34.0)
MCHC: 34.4 g/dL (ref 30.0–36.0)
MCV: 89.3 fL (ref 80.0–100.0)
Monocytes Absolute: 1 10*3/uL (ref 0.1–1.0)
Monocytes Relative: 10 %
Neutro Abs: 7.9 10*3/uL — ABNORMAL HIGH (ref 1.7–7.7)
Neutrophils Relative %: 76 %
Platelets: 266 10*3/uL (ref 150–400)
RBC: 5.53 MIL/uL (ref 4.22–5.81)
RDW: 12.4 % (ref 11.5–15.5)
WBC: 10.3 10*3/uL (ref 4.0–10.5)
nRBC: 0 % (ref 0.0–0.2)

## 2023-03-24 LAB — I-STAT CHEM 8, ED
BUN: 15 mg/dL (ref 6–20)
Calcium, Ion: 1.23 mmol/L (ref 1.15–1.40)
Chloride: 104 mmol/L (ref 98–111)
Creatinine, Ser: 0.9 mg/dL (ref 0.61–1.24)
Glucose, Bld: 136 mg/dL — ABNORMAL HIGH (ref 70–99)
HCT: 51 % (ref 39.0–52.0)
Hemoglobin: 17.3 g/dL — ABNORMAL HIGH (ref 13.0–17.0)
Potassium: 4 mmol/L (ref 3.5–5.1)
Sodium: 137 mmol/L (ref 135–145)
TCO2: 21 mmol/L — ABNORMAL LOW (ref 22–32)

## 2023-03-24 LAB — COMPREHENSIVE METABOLIC PANEL
ALT: 58 U/L — ABNORMAL HIGH (ref 0–44)
AST: 45 U/L — ABNORMAL HIGH (ref 15–41)
Albumin: 4.1 g/dL (ref 3.5–5.0)
Alkaline Phosphatase: 77 U/L (ref 38–126)
Anion gap: 11 (ref 5–15)
BUN: 14 mg/dL (ref 6–20)
CO2: 21 mmol/L — ABNORMAL LOW (ref 22–32)
Calcium: 9.6 mg/dL (ref 8.9–10.3)
Chloride: 103 mmol/L (ref 98–111)
Creatinine, Ser: 0.99 mg/dL (ref 0.61–1.24)
GFR, Estimated: >60 mL/min (ref >60)
Glucose, Bld: 140 mg/dL — ABNORMAL HIGH (ref 70–99)
Potassium: 4.1 mmol/L (ref 3.5–5.1)
Sodium: 135 mmol/L (ref 135–145)
Total Bilirubin: 0.7 mg/dL (ref <1.2)
Total Protein: 7.5 g/dL (ref 6.5–8.1)

## 2023-03-24 LAB — ABO/RH: ABO/RH(D): A POS

## 2023-03-24 MED ORDER — OXYCODONE-ACETAMINOPHEN 5-325 MG PO TABS
1.0000 | ORAL_TABLET | Freq: Once | ORAL | Status: AC
  Administered 2023-03-24: 1 via ORAL
  Filled 2023-03-24: qty 1

## 2023-03-24 MED ORDER — IOHEXOL 350 MG/ML SOLN
75.0000 mL | Freq: Once | INTRAVENOUS | Status: AC | PRN
  Administered 2023-03-24: 75 mL via INTRAVENOUS

## 2023-03-24 MED ORDER — OXYCODONE-ACETAMINOPHEN 5-325 MG PO TABS: 1.0000 | ORAL_TABLET | Freq: Four times a day (QID) | ORAL | 0 refills | Status: AC | PRN

## 2023-03-24 NOTE — ED Triage Notes (Signed)
Pt states he was hunting and exited and wooden box blind when a step broke, and he fell approximately 10', landing on his head. Pt reports LOC. C/o head and neck pain, and R shoulder pain. C-collar placed in triage.

## 2023-03-24 NOTE — ED Provider Notes (Signed)
[place] EMERGENCY DEPARTMENT AT [facility] Provider Note  CSN: [id number] Arrival date & time: 03/24/23 1839  Chief Complaint(s) Fall, Loss of Consciousness, and Shoulder Pain  HPI Devon Smith is a 42 y.o. male who presents emergency department after he fell out of a deer stand and lost consciousness.  Patient says that he took a step, the step broke and he landed on his head and shoulder.  He is complaining of right shoulder pain.Marland Kitchen  He had a cervical collar placed in triage.   Past Medical History Past Medical History:  Diagnosis Date   Hyperlipidemia    There are no active problems to display for this patient.  Home Medication(s) Prior to Admission medications   Medication Sig Start Date End Date Taking? Authorizing Provider  oxyCODONE-acetaminophen (PERCOCET/ROXICET) 5-325 MG tablet Take 1 tablet by mouth every 6 (six) hours as needed for severe pain (pain score 7-10). 03/24/23  Yes Anders Simmonds T, DO  metoprolol tartrate (LOPRESSOR) 100 MG tablet TAKE 1 TABLET BY MOUTH 2 HOURS PRIOR TO CT SCAN. 05/18/22   Lewayne Bunting, MD  rosuvastatin (CRESTOR) 40 MG tablet Take 1 tablet (40 mg total) by mouth daily. 05/18/22   Lewayne Bunting, MD                                                                                                                                    Past Surgical History Past Surgical History:  Procedure Laterality Date   EXTERNAL EAR SURGERY     KNEE SURGERY     Family History Family History  Problem Relation Age of Onset   Coronary artery disease Father     Social History Social History   Tobacco Use   Smoking status: Every Day    Types: Cigarettes   Smokeless tobacco: Current  Substance Use Topics   Alcohol use: Yes    Comment: Occasional   Drug use: Never   Allergies Patient has no known allergies.  Review of Systems Review of Systems  Physical Exam Vital Signs  I have reviewed the triage vital signs BP 139/76  (BP Location: Right Arm)   Pulse 100   Temp 100.1 F (37.8 C) (Oral)   Resp 16   Ht 6' (1.829 m)   Wt 113.4 kg   SpO2 97%   BMI 33.91 kg/m   Physical Exam Vitals reviewed.  HENT:     Head: Normocephalic and atraumatic.  Cardiovascular:     Rate and Rhythm: Normal rate.  Musculoskeletal:     Comments: No midline spinous process tenderness in the thoracic or lumbar spine  Skin:    General: Skin is warm and dry.  Neurological:     General: No focal deficit present.     Mental Status: He is alert.     Cranial Nerves: No cranial nerve deficit.     Sensory: No sensory deficit.  Motor: No weakness.     Gait: Gait normal.     Comments: 5 5 grip strength bilaterally     ED Results and Treatments Labs (all labs ordered are listed, but only abnormal results are displayed) Labs Reviewed  COMPREHENSIVE METABOLIC PANEL - Abnormal; Notable for the following components:      Result Value   CO2 21 (*)    Glucose, Bld 140 (*)    AST 45 (*)    ALT 58 (*)    All other components within normal limits  CBC WITH DIFFERENTIAL/PLATELET - Abnormal; Notable for the following components:   Neutro Abs 7.9 (*)    All other components within normal limits  I-STAT CHEM 8, ED - Abnormal; Notable for the following components:   Glucose, Bld 136 (*)    TCO2 21 (*)    Hemoglobin 17.3 (*)    All other components within normal limits  TYPE AND SCREEN  ABO/RH                                                                                                                          Radiology CT CHEST ABDOMEN PELVIS W CONTRAST Result Date: 03/24/2023 CLINICAL DATA:  Blunt trauma.  Loss of consciousness.  Fall. EXAM: CT CHEST, ABDOMEN, AND PELVIS WITH CONTRAST TECHNIQUE: Multidetector CT imaging of the chest, abdomen and pelvis was performed following the standard protocol during bolus administration of intravenous contrast. RADIATION DOSE REDUCTION: This exam was performed according to the  departmental dose-optimization program which includes automated exposure control, adjustment of the mA and/or kV according to patient size and/or use of iterative reconstruction technique. CONTRAST:  75mL OMNIPAQUE IOHEXOL 350 MG/ML SOLN COMPARISON:  Right shoulder radiograph dated 03/24/2023. CT abdomen pelvis dated 10/11/2017. FINDINGS: CT CHEST FINDINGS Cardiovascular: There is no cardiomegaly or pericardial effusion. The thoracic aorta is unremarkable. The origins of the great vessels of the aortic arch and the central pulmonary arteries appear patent. Mediastinum/Nodes: No hilar or mediastinal adenopathy. The esophagus and the thyroid gland are grossly unremarkable. No mediastinal fluid collection. Lungs/Pleura: Minimal bibasilar subpleural atelectasis. No focal consolidation, pleural effusion, or pneumothorax. The central airways are patent. Musculoskeletal: Comminuted and mildly displaced fracture of the midportion of the right clavicle. CT ABDOMEN PELVIS FINDINGS No intra-abdominal free air or free fluid. Hepatobiliary: No focal liver abnormality is seen. No gallstones, gallbladder wall thickening, or biliary dilatation. Pancreas: Unremarkable. No pancreatic ductal dilatation or surrounding inflammatory changes. Spleen: Normal in size without focal abnormality. Adrenals/Urinary Tract: The adrenal glands unremarkable. The kidneys, visualized ureters, and urinary bladder appear unremarkable. Stomach/Bowel: There is no bowel obstruction or active inflammation. The appendix is normal. Vascular/Lymphatic: The abdominal aorta and IVC unremarkable. No portal venous gas. There is no adenopathy. Reproductive: The prostate and seminal vesicles are grossly unremarkable. No pelvic mass. Other: None Musculoskeletal: No acute or significant osseous findings. IMPRESSION: 1. Comminuted and mildly displaced fracture of the midportion of the right clavicle. 2. No other acute/traumatic intrathoracic,  abdominal, or pelvic  pathology. Electronically Signed   By: Elgie Collard M.D.   On: 03/24/2023 22:26   CT Head Wo Contrast Result Date: 03/24/2023 CLINICAL DATA:  Neck EXAM: CT HEAD WITHOUT CONTRAST CT CERVICAL SPINE WITHOUT CONTRAST TECHNIQUE: Multidetector CT imaging of the head and cervical spine was performed following the standard protocol without intravenous contrast. Multiplanar CT image reconstructions of the cervical spine were also generated. RADIATION DOSE REDUCTION: This exam was performed according to the departmental dose-optimization program which includes automated exposure control, adjustment of the mA and/or kV according to patient size and/or use of iterative reconstruction technique. COMPARISON:  Fell approximately 10 feet, landing on his head, head and neck pain FINDINGS: CT HEAD FINDINGS Brain: No evidence of acute infarct, hemorrhage, mass, mass effect, or midline shift. No hydrocephalus or extra-axial fluid collection. Pituitary and craniocervical junction within normal limits. Vascular: No hyperdense vessel. Skull: Negative for fracture or focal lesion. Postsurgical changes in the right mastoid. Sinuses/Orbits: Minimal mucosal thickening in the ethmoid air cells. No acute finding in the orbits. Other: Small hematoma and stranding in the right frontal scalp and right premalar soft tissues. CT CERVICAL SPINE FINDINGS Alignment: No traumatic listhesis. Skull base and vertebrae: No acute fracture or suspicious osseous lesion. Remote fracture of the spinous process of C7. Soft tissues and spinal canal: No prevertebral fluid or swelling. No visible canal hematoma. Disc levels: Mild degenerative changes in the cervical spine.No high-grade spinal canal stenosis. Upper chest: For findings in the thorax, please see same day CT chest. IMPRESSION: 1. No acute intracranial process. 2. No acute fracture or traumatic listhesis in the cervical spine. Electronically Signed   By: Wiliam Ke M.D.   On: 03/24/2023 22:21    CT Cervical Spine Wo Contrast Result Date: 03/24/2023 CLINICAL DATA:  Neck EXAM: CT HEAD WITHOUT CONTRAST CT CERVICAL SPINE WITHOUT CONTRAST TECHNIQUE: Multidetector CT imaging of the head and cervical spine was performed following the standard protocol without intravenous contrast. Multiplanar CT image reconstructions of the cervical spine were also generated. RADIATION DOSE REDUCTION: This exam was performed according to the departmental dose-optimization program which includes automated exposure control, adjustment of the mA and/or kV according to patient size and/or use of iterative reconstruction technique. COMPARISON:  Fell approximately 10 feet, landing on his head, head and neck pain FINDINGS: CT HEAD FINDINGS Brain: No evidence of acute infarct, hemorrhage, mass, mass effect, or midline shift. No hydrocephalus or extra-axial fluid collection. Pituitary and craniocervical junction within normal limits. Vascular: No hyperdense vessel. Skull: Negative for fracture or focal lesion. Postsurgical changes in the right mastoid. Sinuses/Orbits: Minimal mucosal thickening in the ethmoid air cells. No acute finding in the orbits. Other: Small hematoma and stranding in the right frontal scalp and right premalar soft tissues. CT CERVICAL SPINE FINDINGS Alignment: No traumatic listhesis. Skull base and vertebrae: No acute fracture or suspicious osseous lesion. Remote fracture of the spinous process of C7. Soft tissues and spinal canal: No prevertebral fluid or swelling. No visible canal hematoma. Disc levels: Mild degenerative changes in the cervical spine.No high-grade spinal canal stenosis. Upper chest: For findings in the thorax, please see same day CT chest. IMPRESSION: 1. No acute intracranial process. 2. No acute fracture or traumatic listhesis in the cervical spine. Electronically Signed   By: Wiliam Ke M.D.   On: 03/24/2023 22:21   DG Shoulder Right Result Date: 03/24/2023 CLINICAL DATA:  Fall, pain  EXAM: RIGHT SHOULDER - 2+ VIEW COMPARISON:  04/14/2020 FINDINGS: Acute, comminuted fracture  involving the mid right clavicle. There are 2 fracture fragments Sutter superiorly displaced and measure up to 2.5 cm and 1.3 cm in size. AC joint alignment is maintained. Proximal humerus intact. No significant degenerative changes. Soft tissue swelling in the supraclavicular region. IMPRESSION: Acute, comminuted fracture involving the mid right clavicle. Electronically Signed   By: Duanne Guess D.O.   On: 03/24/2023 19:19    Pertinent labs & imaging results that were available during my care of the patient were reviewed by me and considered in my medical decision making (see MDM for details).  Medications Ordered in ED Medications  oxyCODONE-acetaminophen (PERCOCET/ROXICET) 5-325 MG per tablet 1 tablet (has no administration in time range)  oxyCODONE-acetaminophen (PERCOCET/ROXICET) 5-325 MG per tablet 1 tablet (1 tablet Oral Given 03/24/23 2018)  iohexol (OMNIPAQUE) 350 MG/ML injection 75 mL (75 mLs Intravenous Contrast Given 03/24/23 2208)                                                                                                                                     Procedures Procedures  (including critical care time)  Medical Decision Making / ED Course   This patient presents to the ED for concern of fall from height of approximately 10 feet, this involves an extensive number of treatment options, and is a complaint that carries with it a high risk of complications and morbidity.  The differential diagnosis includes intracranial hemorrhage, C-spine injury, clavicle fracture, thoracic trauma, abdominal trauma.  MDM: The patient's mechanism of injury, will obtain imaging of the patient's head, neck, chest abdomen pelvis.  When I entered the room, the patient was standing and change into a gown.  He is having no tenderness in his thoracic or lumbar spine.  Pain in the right shoulder, but  otherwise no pain or weakness in his extremities.  Patient not on blood thinners.  Appears to be mechanical fall.  11 PM-my dependent review the patient's head CT shows no intracranial hemorrhage.  No fracture in the patient's cervical spine, no injury in the chest abdomen pelvis.  Patient with a comminuted, mildly displaced right clavicle fracture.  Placed in sling.  Spoke with orthopedic, Dr. Tonny Bollman.  Patient will follow-up on an outpatient basis.  Will discharge home.   Additional history obtained: -Additional history obtained from wife at bedside -External records from outside source obtained and reviewed including: Chart review including previous notes, labs, imaging, consultation notes   Lab Tests: -I ordered, reviewed, and interpreted labs.   The pertinent results include:   Labs Reviewed  COMPREHENSIVE METABOLIC PANEL - Abnormal; Notable for the following components:      Result Value   CO2 21 (*)    Glucose, Bld 140 (*)    AST 45 (*)    ALT 58 (*)    All other components within normal limits  CBC WITH DIFFERENTIAL/PLATELET - Abnormal; Notable for the following components:   Neutro Abs  7.9 (*)    All other components within normal limits  I-STAT CHEM 8, ED - Abnormal; Notable for the following components:   Glucose, Bld 136 (*)    TCO2 21 (*)    Hemoglobin 17.3 (*)    All other components within normal limits  TYPE AND SCREEN  ABO/RH      EKG sinus tachycardia  EKG Interpretation Date/Time:    Ventricular Rate:    PR Interval:    QRS Duration:    QT Interval:    QTC Calculation:   R Axis:      Text Interpretation:           Imaging Studies ordered: I ordered imaging studies including CT imaging of the head, neck, chest abdomen pelvis, clavicle I independently visualized and interpreted imaging. I agree with the radiologist interpretation   Medicines ordered and prescription drug management: Meds ordered this encounter  Medications    oxyCODONE-acetaminophen (PERCOCET/ROXICET) 5-325 MG per tablet 1 tablet    Refill:  0   iohexol (OMNIPAQUE) 350 MG/ML injection 75 mL   oxyCODONE-acetaminophen (PERCOCET/ROXICET) 5-325 MG per tablet 1 tablet    Refill:  0   oxyCODONE-acetaminophen (PERCOCET/ROXICET) 5-325 MG tablet    Sig: Take 1 tablet by mouth every 6 (six) hours as needed for severe pain (pain score 7-10).    Dispense:  16 tablet    Refill:  0    -I have reviewed the patients home medicines and have made adjustments as needed   Social Determinants of Health:  Factors impacting patients care include:    Reevaluation: After the interventions noted above, I reevaluated the patient and found that they have :improved  Co morbidities that complicate the patient evaluation  Past Medical History:  Diagnosis Date   Hyperlipidemia       Dispostion: I considered admission for this patient, but in the absence of other traumatic injuries, patient appropriate for follow-up for isolated orthopedic injury.     Final Clinical Impression(s) / ED Diagnoses Final diagnoses:  Closed nondisplaced fracture of shaft of right clavicle, initial encounter     @PCDICTATION @    Anders Simmonds T, DO 03/24/23 2307

## 2023-03-24 NOTE — Discharge Instructions (Addendum)
You can take 1 Percocet every 6 hours.  This medication does contain Tylenol, do not take more than 500 mg of additional Tylenol with this medication.  You can also take 400 mg of ibuprofen every 6 hours for pain.  On Monday, call Dr. Kyra Leyland office to set up an appointment.  Keep your sling on, you may remove it when you shower.

## 2023-03-25 LAB — TYPE AND SCREEN
ABO/RH(D): A POS
Antibody Screen: NEGATIVE

## 2023-03-27 ENCOUNTER — Other Ambulatory Visit (HOSPITAL_COMMUNITY): Payer: Self-pay

## 2023-03-27 MED ORDER — TRAMADOL HCL 50 MG PO TABS
50.0000 mg | ORAL_TABLET | Freq: Four times a day (QID) | ORAL | 0 refills | Status: DC | PRN
  Filled 2023-03-27: qty 20, 5d supply, fill #0

## 2023-03-27 MED ORDER — METHOCARBAMOL 750 MG PO TABS
750.0000 mg | ORAL_TABLET | Freq: Three times a day (TID) | ORAL | 0 refills | Status: DC
  Filled 2023-03-27: qty 21, 7d supply, fill #0

## 2023-03-27 MED ORDER — CELECOXIB 200 MG PO CAPS
200.0000 mg | ORAL_CAPSULE | Freq: Two times a day (BID) | ORAL | 0 refills | Status: DC
  Filled 2023-03-27: qty 60, 30d supply, fill #0

## 2023-03-27 MED ORDER — ONDANSETRON 4 MG PO TBDP
4.0000 mg | ORAL_TABLET | Freq: Three times a day (TID) | ORAL | 0 refills | Status: AC
  Filled 2023-03-27: qty 15, 5d supply, fill #0

## 2023-03-28 DIAGNOSIS — M25511 Pain in right shoulder: Secondary | ICD-10-CM | POA: Diagnosis not present

## 2023-04-03 ENCOUNTER — Other Ambulatory Visit (HOSPITAL_COMMUNITY): Payer: Self-pay

## 2023-04-03 ENCOUNTER — Other Ambulatory Visit (HOSPITAL_BASED_OUTPATIENT_CLINIC_OR_DEPARTMENT_OTHER): Payer: Self-pay

## 2023-04-03 DIAGNOSIS — S42001A Fracture of unspecified part of right clavicle, initial encounter for closed fracture: Secondary | ICD-10-CM | POA: Diagnosis not present

## 2023-04-03 DIAGNOSIS — Y999 Unspecified external cause status: Secondary | ICD-10-CM | POA: Diagnosis not present

## 2023-04-03 DIAGNOSIS — S42021A Displaced fracture of shaft of right clavicle, initial encounter for closed fracture: Secondary | ICD-10-CM | POA: Diagnosis not present

## 2023-04-03 DIAGNOSIS — X58XXXA Exposure to other specified factors, initial encounter: Secondary | ICD-10-CM | POA: Diagnosis not present

## 2023-04-03 MED ORDER — CELECOXIB 200 MG PO CAPS
200.0000 mg | ORAL_CAPSULE | Freq: Two times a day (BID) | ORAL | 0 refills | Status: AC
  Filled 2023-04-03: qty 60, 30d supply, fill #0

## 2023-04-03 MED ORDER — ONDANSETRON 4 MG PO TBDP
4.0000 mg | ORAL_TABLET | Freq: Three times a day (TID) | ORAL | 0 refills | Status: AC | PRN
  Filled 2023-04-03: qty 10, 4d supply, fill #0

## 2023-04-03 MED ORDER — METHOCARBAMOL 750 MG PO TABS
750.0000 mg | ORAL_TABLET | Freq: Three times a day (TID) | ORAL | 0 refills | Status: AC
  Filled 2023-04-03: qty 21, 7d supply, fill #0

## 2023-04-03 MED ORDER — TRAMADOL HCL 50 MG PO TABS
50.0000 mg | ORAL_TABLET | Freq: Four times a day (QID) | ORAL | 0 refills | Status: AC | PRN
  Filled 2023-04-03: qty 40, 5d supply, fill #0

## 2023-04-03 MED ORDER — ACETAMINOPHEN 500 MG PO TABS
1000.0000 mg | ORAL_TABLET | Freq: Three times a day (TID) | ORAL | 0 refills | Status: AC | PRN
  Filled 2023-04-03: qty 100, 17d supply, fill #0

## 2023-04-04 ENCOUNTER — Other Ambulatory Visit (HOSPITAL_COMMUNITY): Payer: Self-pay

## 2023-06-05 ENCOUNTER — Other Ambulatory Visit (HOSPITAL_BASED_OUTPATIENT_CLINIC_OR_DEPARTMENT_OTHER): Payer: Self-pay | Admitting: Family Medicine

## 2023-06-05 DIAGNOSIS — E785 Hyperlipidemia, unspecified: Secondary | ICD-10-CM | POA: Diagnosis not present

## 2023-06-05 DIAGNOSIS — Z0001 Encounter for general adult medical examination with abnormal findings: Secondary | ICD-10-CM

## 2023-06-21 DIAGNOSIS — K219 Gastro-esophageal reflux disease without esophagitis: Secondary | ICD-10-CM | POA: Diagnosis not present

## 2023-06-21 DIAGNOSIS — K625 Hemorrhage of anus and rectum: Secondary | ICD-10-CM | POA: Diagnosis not present

## 2023-06-21 DIAGNOSIS — R1030 Lower abdominal pain, unspecified: Secondary | ICD-10-CM | POA: Diagnosis not present

## 2023-06-28 DIAGNOSIS — Z1211 Encounter for screening for malignant neoplasm of colon: Secondary | ICD-10-CM | POA: Diagnosis not present

## 2023-06-28 DIAGNOSIS — K219 Gastro-esophageal reflux disease without esophagitis: Secondary | ICD-10-CM | POA: Diagnosis not present

## 2023-06-28 DIAGNOSIS — R1030 Lower abdominal pain, unspecified: Secondary | ICD-10-CM | POA: Diagnosis not present

## 2023-06-28 DIAGNOSIS — K921 Melena: Secondary | ICD-10-CM | POA: Diagnosis not present

## 2023-07-25 DIAGNOSIS — S42001D Fracture of unspecified part of right clavicle, subsequent encounter for fracture with routine healing: Secondary | ICD-10-CM | POA: Diagnosis not present
